# Patient Record
Sex: Female | Born: 1995 | Race: Black or African American | Hispanic: No | Marital: Single | Smoking: Never smoker
Health system: Southern US, Community
[De-identification: ages and names within clinical notes are randomized; demographics above are authoritative.]

---
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
## 2017-12-27 IMAGING — US US PELVIS COMPLETE
1 series · 14 of 25 positions shown · non-contrast
Comparison: None

CLINICAL DATA: Right lower quadrant abdominal pain. Family history
of ovarian cysts. Duration of symptoms 1 day.

EXAM:
TRANSABDOMINAL AND TRANSVAGINAL ULTRASOUND OF PELVIS
TECHNIQUE: Both transabdominal and transvaginal ultrasound examinations of the
pelvis were performed. Transabdominal technique was performed for
global imaging of the pelvis including uterus, ovaries, adnexal
regions, and pelvic cul-de-sac. It was necessary to proceed with
endovaginal exam following the transabdominal exam to visualize the
ovaries more clearly.

[Series 1: us pelvis complete · 0.18mm/px · 14 of 77 slices shown]
[im 1/77]
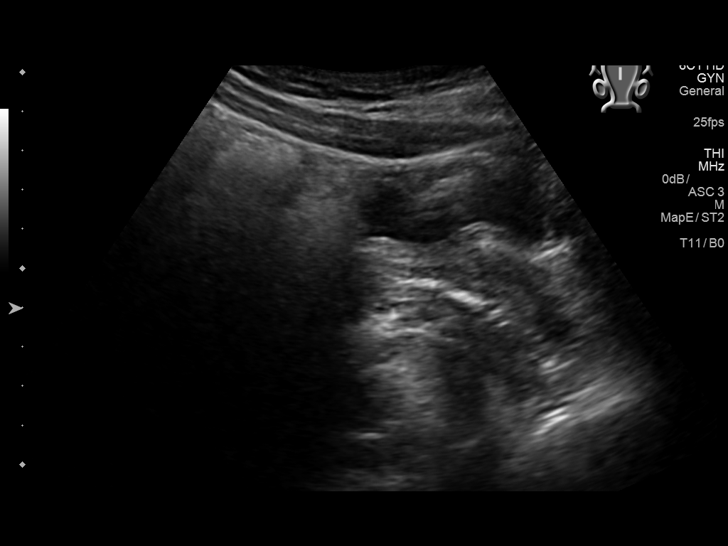
[im 7/77]
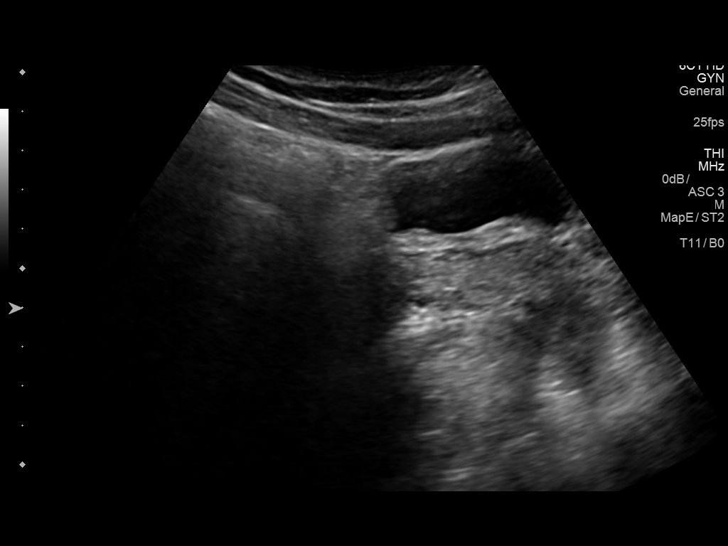
[im 13/77]
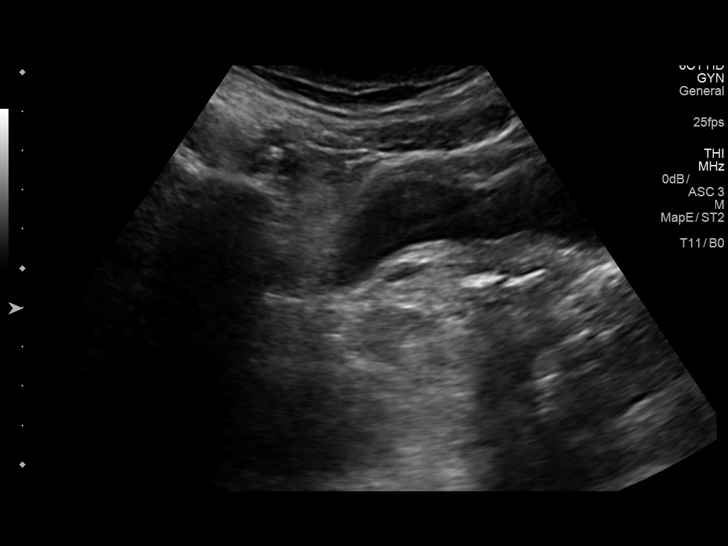
[im 20/77]
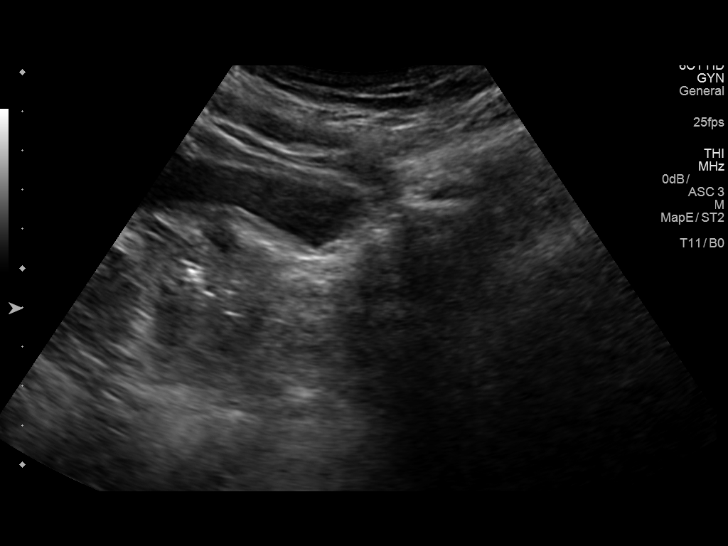
[im 26/77]
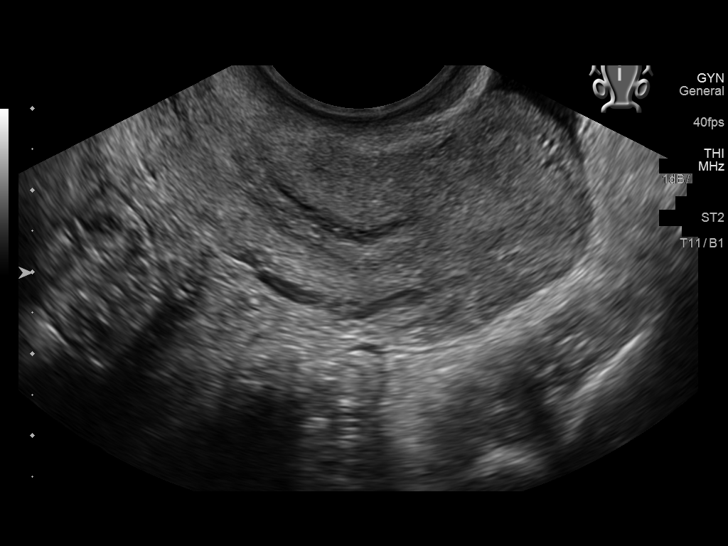
[im 29/77]
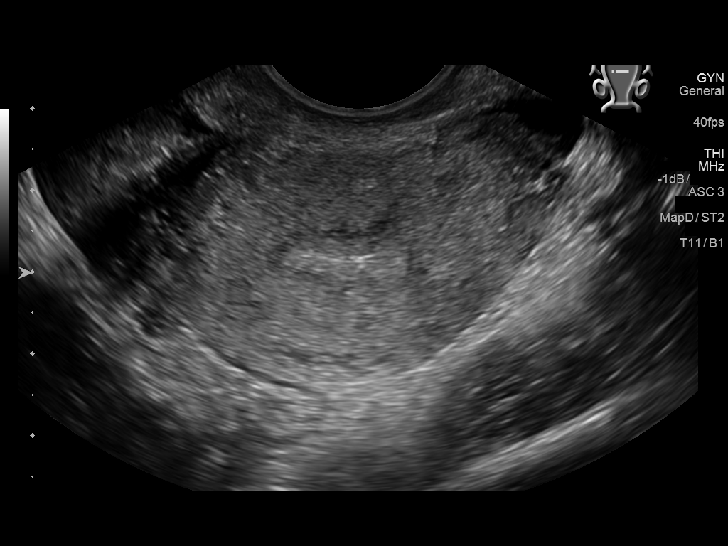
[im 35/77]
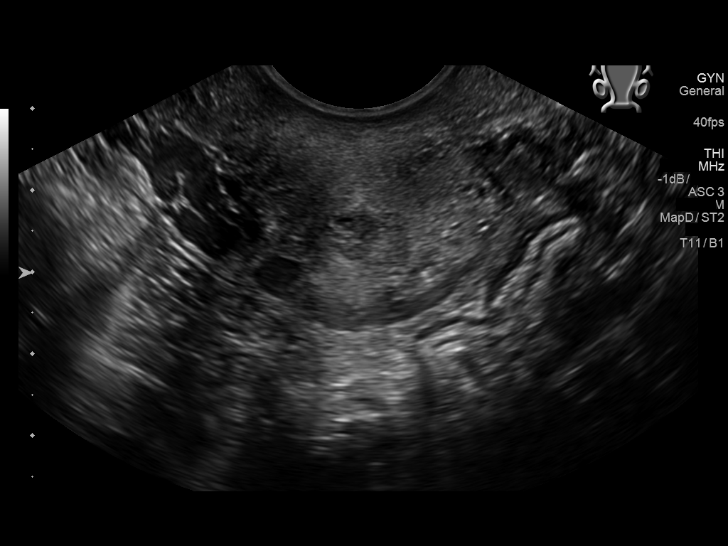
[im 42/77]
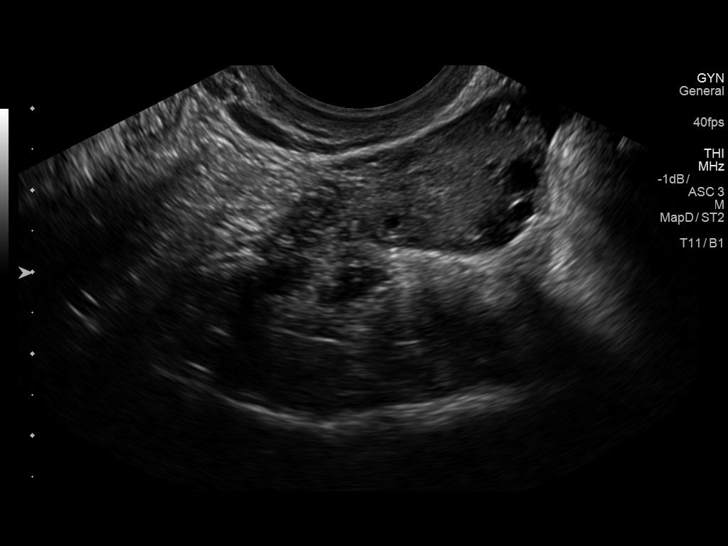
[im 48/77]
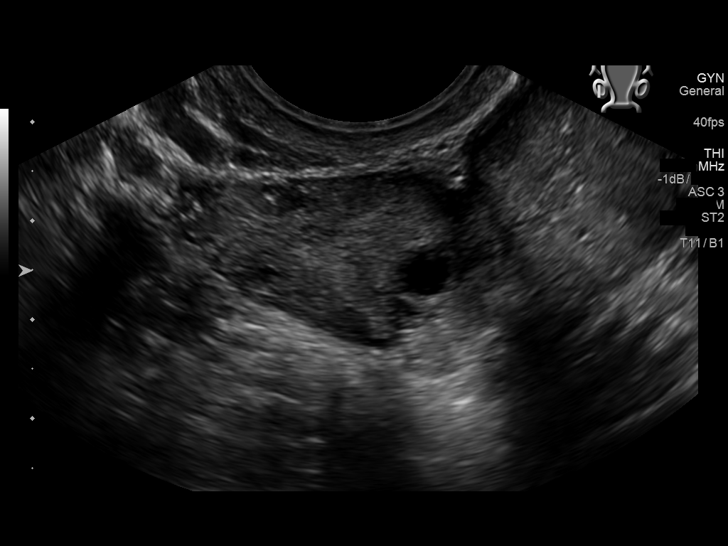
[im 51/77]
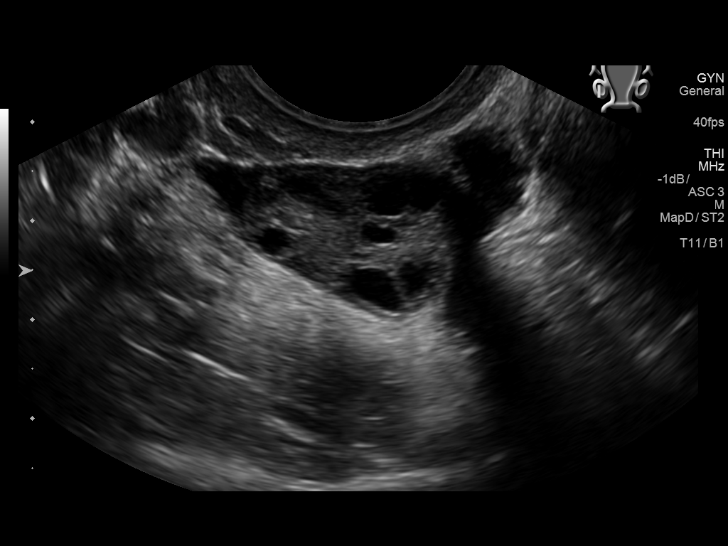
[im 58/77]
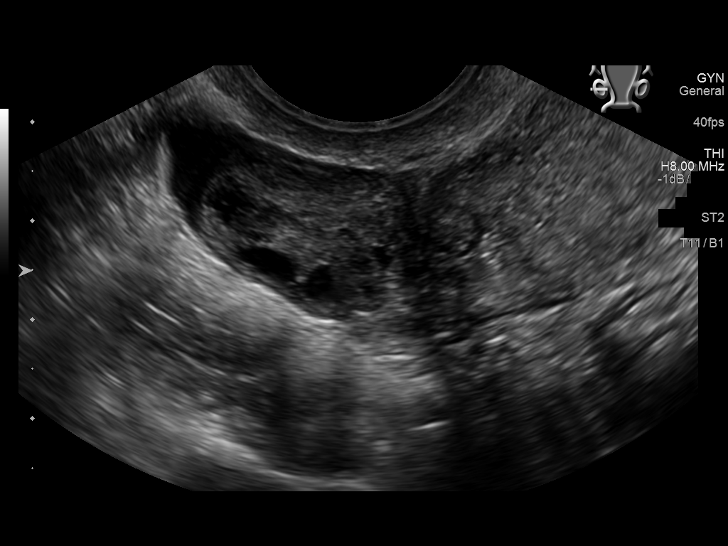
[im 64/77]
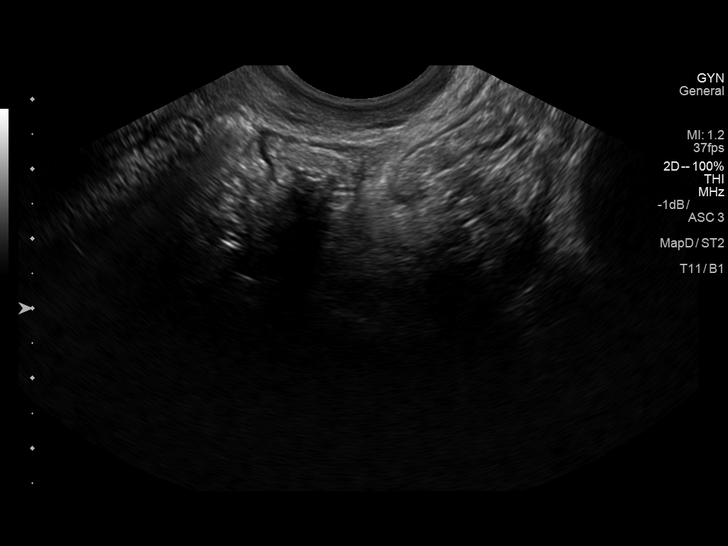
[im 70/77]
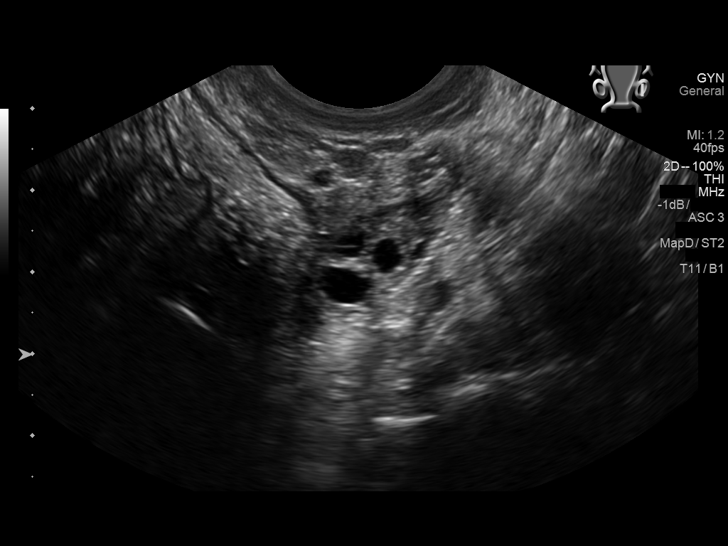
[im 77/77]
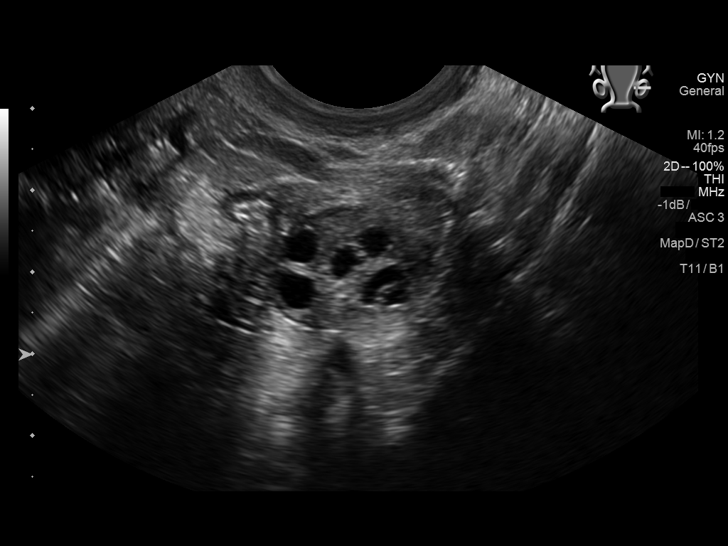

[14 of 25 positions shown; findings below may reference images not displayed]

FINDINGS: Uterus

Measurements: Retroverted, 5.5 x 2.6 x 4.4 cm.. No fibroids or other
mass visualized.

Endometrium

Thickness: 8 mm..  No focal abnormality visualized.

Right ovary

Measurements: 2.9 x 1.7 x 2.2 cm.. Normal appearance/no adnexal
mass.

Left ovary

Measurements: 2.9 x 1.4 x 1.8 cm.. Normal appearance/no adnexal
mass.

Other findings

Tiny matter free fluid, physiologic at this age.
IMPRESSION: Normal examination. No evidence of ovarian cysts other than normal
small follicular cysts. Tiny amount of free fluid, within normal
limits at this age.

## 2018-03-15 ENCOUNTER — Encounter: Payer: Self-pay | Admitting: Emergency Medicine

## 2018-03-15 ENCOUNTER — Emergency Department
Admission: EM | Admit: 2018-03-15 | Discharge: 2018-03-15 | Disposition: A | Discharge status: Home / Self Care | Payer: BC Managed Care – PPO | Attending: Student in an Organized Health Care Education/Training Program | Admitting: Student in an Organized Health Care Education/Training Program

## 2018-03-15 ENCOUNTER — Other Ambulatory Visit: Payer: Self-pay

## 2018-03-15 DIAGNOSIS — S81852A Open bite, left lower leg, initial encounter: Secondary | ICD-10-CM

## 2018-03-15 DIAGNOSIS — Y998 Other external cause status: Secondary | ICD-10-CM | POA: Diagnosis not present

## 2018-03-15 DIAGNOSIS — Y92008 Other place in unspecified non-institutional (private) residence as the place of occurrence of the external cause: Secondary | ICD-10-CM | POA: Diagnosis not present

## 2018-03-15 DIAGNOSIS — Y939 Activity, unspecified: Secondary | ICD-10-CM | POA: Insufficient documentation

## 2018-03-15 DIAGNOSIS — W540XXA Bitten by dog, initial encounter: Secondary | ICD-10-CM | POA: Insufficient documentation

## 2018-03-15 MED ORDER — BACITRACIN ZINC 500 UNIT/GM EX OINT
TOPICAL_OINTMENT | Freq: Once | CUTANEOUS | Status: AC
  Administered 2018-03-15: 1 via TOPICAL
  Filled 2018-03-15: qty 0.9

## 2018-03-15 MED ORDER — DOXYCYCLINE HYCLATE 100 MG PO TABS
100.0000 mg | ORAL_TABLET | Freq: Once | ORAL | Status: AC
  Administered 2018-03-15: 100 mg via ORAL
  Filled 2018-03-15: qty 1

## 2018-03-15 MED ORDER — DOXYCYCLINE HYCLATE 100 MG PO TABS: 100.0000 mg | ORAL_TABLET | Freq: Two times a day (BID) | ORAL | 0 refills | Status: AC

## 2018-03-15 MED ORDER — METRONIDAZOLE 500 MG PO TABS: 500.0000 mg | ORAL_TABLET | Freq: Three times a day (TID) | ORAL | 0 refills | Status: AC

## 2018-03-15 MED ORDER — METRONIDAZOLE 500 MG PO TABS
500.0000 mg | ORAL_TABLET | Freq: Once | ORAL | Status: AC
  Administered 2018-03-15: 500 mg via ORAL
  Filled 2018-03-15: qty 1

## 2018-03-15 NOTE — ED Notes (Signed)
[employer] at bedside, aware of dog bite accident

## 2018-03-15 NOTE — ED Triage Notes (Addendum)
Pt to ED via POV with c/o dog bite to LLE today. Dog was a friend, states up to date on rabies vac. Three open wounds noted to leg. Bleeding controlled

## 2018-03-15 NOTE — ED Provider Notes (Signed)
[facility] Emergency Department Provider Note ____________________________________________  Time seen: 1639  I have reviewed the triage vital signs and the nursing notes.  HISTORY  Chief Complaint  Animal Bite  HPI Emily Blackburn is a 22 y.o. female presents to the ED for evaluation and management of a dog bite.  Patient describes an unprovoked bite that occurred yesterday at a friend's house.  She describes she was seen in the friend's living room when his 1-year-old pitbull puppy was playing.  Patient had no direct interaction with the puppy, but describes that the dog spontaneously ran up and bit her on the left lower leg.  She sustained 3 large open cuts and several superficial abrasion to the lower leg.  He presents today due to increasing pain and swelling to the leg.  She denies any fevers, chills, sweats.  She is clean the area in the interim with hydrogen peroxide.  The wound is clean, dry, and covered.  She reports her routine vaccines are up-to-date including tetanus.  She denies any other injury at this time.  She does report a penicillin allergy by history of a rash in infancy.  History reviewed. No pertinent past medical history.  There are no active problems to display for this patient.  History reviewed. No pertinent surgical history.  Prior to Admission medications   Medication Sig Start Date End Date Taking? Authorizing Provider  doxycycline (VIBRA-TABS) 100 MG tablet Take 1 tablet (100 mg total) by mouth 2 (two) times daily. 03/15/18   Aleathea Pugmire, Charlesetta Ivory, PA-C  metroNIDAZOLE (FLAGYL) 500 MG tablet Take 1 tablet (500 mg total) by mouth 3 (three) times daily for 5 days. 03/15/18 03/20/18  Malan Werk, Charlesetta Ivory, PA-C    Allergies Penicillins  No family history on file.  Social History Social History   Tobacco Use  . Smoking status: Never Smoker  . Smokeless tobacco: Never Used  Substance Use Topics  . Alcohol use: No  . Drug use:  Not on file    Review of Systems  Constitutional: Negative for fever. Cardiovascular: Negative for chest pain. Respiratory: Negative for shortness of breath. Musculoskeletal: Negative for back pain. Skin: Negative for rash.  LLE dog bite as above. Neurological: Negative for headaches, focal weakness or numbness. ____________________________________________  PHYSICAL EXAM:  VITAL SIGNS: ED Triage Vitals  Enc Vitals Group     BP 03/15/18 1603 112/72     Pulse Rate 03/15/18 1603 95     Resp 03/15/18 1603 16     Temp 03/15/18 1603 98.9 F (37.2 C)     Temp Source 03/15/18 1603 Oral     SpO2 03/15/18 1603 100 %     Weight 03/15/18 1604 130 lb (59 kg)     Height 03/15/18 1604  (1.626 m)     Head Circumference --      Peak Flow --      Pain Score 03/15/18 1604 7     Pain Loc --      Pain Edu? --      Excl. in GC? --     Constitutional: Alert and oriented. Well appearing and in no distress. Head: Normocephalic and atraumatic. Cardiovascular: Normal rate, regular rhythm. Normal distal pulses. Respiratory: Normal respiratory effort. No wheezes/rales/rhonchi. Musculoskeletal: Nontender with normal range of motion in all extremities.  Neurologic:  Normal gait without ataxia. Normal speech and language. No gross focal neurologic deficits are appreciated. Skin:  Skin is warm, dry and intact. No rash noted.  Patient with 3 distinct, 1 cm superficial wounds to the left lower leg.  Subcutaneous fat is exposed but no active bleeding is appreciated.  She has several superficial abrasions in the same region of the lower leg.  Some early local erythema is appreciated.  No purulence is noted. Psychiatric: Mood and affect are normal. Patient exhibits appropriate insight and judgment. ____________________________________________  PROCEDURES  Procedures Metronidazole 500 mg PO Doxycycline 100 mg PO Bacitracin ointment ____________________________________________  INITIAL IMPRESSION /  ASSESSMENT AND PLAN / ED COURSE  Patient with ED evaluation and management of a dog bite to the left lower leg.  Patient sustained an unprovoked bite from a friend's dog.  Is reported that the dog is current on his vaccines.  [place] PD present prior to discharge to take report as appropriate.  Patient will be discharged with prescriptions for metronidazole and doxycycline to dose as directed.  She will follow with primary pediatrician or return to the ED as needed.  Wound care instructions have provided. ____________________________________________  FINAL CLINICAL IMPRESSION(S) / ED DIAGNOSES  Final diagnoses:  Dog bite of left lower leg, initial encounter      Lissa Hoard, PA-C 03/15/18 2352    Willy Eddy, MD 03/16/18 [id number]

## 2018-03-15 NOTE — Discharge Instructions (Signed)
Keep the wounds clean, dry, and covered. Wash with soap and water daily. Apply antibiotic ointment and a non-stick dressing to promote healing. Take the antibiotics as directed. Follow-up with your provider or return as needed.
